# Patient Record
Sex: Female | Born: 1975 | Race: Black or African American | Hispanic: No | Marital: Married | Smoking: Never smoker
Health system: Southern US, Community
[De-identification: ages and names within clinical notes are randomized; demographics above are authoritative.]

## PROBLEM LIST (undated history)

## (undated) DIAGNOSIS — I10 Essential (primary) hypertension: Secondary | ICD-10-CM

## (undated) DIAGNOSIS — G459 Transient cerebral ischemic attack, unspecified: Secondary | ICD-10-CM

## (undated) HISTORY — PX: TUBAL LIGATION: SHX77

## (undated) HISTORY — PX: CHOLECYSTECTOMY: SHX55

---

## 2019-05-21 ENCOUNTER — Emergency Department: Payer: 59

## 2019-05-21 ENCOUNTER — Emergency Department
Admission: EM | Admit: 2019-05-21 | Discharge: 2019-05-22 | Disposition: A | Discharge status: Home / Self Care | Payer: 59 | Attending: Emergency Medicine | Admitting: Emergency Medicine

## 2019-05-21 ENCOUNTER — Other Ambulatory Visit: Payer: Self-pay

## 2019-05-21 DIAGNOSIS — G43909 Migraine, unspecified, not intractable, without status migrainosus: Secondary | ICD-10-CM | POA: Insufficient documentation

## 2019-05-21 DIAGNOSIS — I10 Essential (primary) hypertension: Secondary | ICD-10-CM | POA: Insufficient documentation

## 2019-05-21 DIAGNOSIS — R519 Headache, unspecified: Secondary | ICD-10-CM | POA: Diagnosis present

## 2019-05-21 HISTORY — DX: Essential (primary) hypertension: I10

## 2019-05-21 HISTORY — DX: Transient cerebral ischemic attack, unspecified: G45.9

## 2019-05-21 LAB — CBC WITH DIFFERENTIAL/PLATELET
Abs Immature Granulocytes: 0.01 10*3/uL (ref 0.00–0.07)
Basophils Absolute: 0 10*3/uL (ref 0.0–0.1)
Basophils Relative: 0 %
Eosinophils Absolute: 0.1 10*3/uL (ref 0.0–0.5)
Eosinophils Relative: 2 %
HCT: 40.4 % (ref 36.0–46.0)
Hemoglobin: 13.1 g/dL (ref 12.0–15.0)
Immature Granulocytes: 0 %
Lymphocytes Relative: 35 %
Lymphs Abs: 1.9 10*3/uL (ref 0.7–4.0)
MCH: 30.8 pg (ref 26.0–34.0)
MCHC: 32.4 g/dL (ref 30.0–36.0)
MCV: 95.1 fL (ref 80.0–100.0)
Monocytes Absolute: 0.5 10*3/uL (ref 0.1–1.0)
Monocytes Relative: 10 %
Neutro Abs: 2.8 10*3/uL (ref 1.7–7.7)
Neutrophils Relative %: 53 %
Platelets: 309 10*3/uL (ref 150–400)
RBC: 4.25 MIL/uL (ref 3.87–5.11)
RDW: 13.5 % (ref 11.5–15.5)
WBC: 5.4 10*3/uL (ref 4.0–10.5)
nRBC: 0 % (ref 0.0–0.2)

## 2019-05-21 LAB — BASIC METABOLIC PANEL
Anion gap: 7 (ref 5–15)
BUN: 11 mg/dL (ref 6–20)
CO2: 28 mmol/L (ref 22–32)
Calcium: 9.3 mg/dL (ref 8.9–10.3)
Chloride: 103 mmol/L (ref 98–111)
Creatinine, Ser: 0.97 mg/dL (ref 0.44–1.00)
GFR calc Af Amer: >60 mL/min (ref >60)
GFR calc non Af Amer: >60 mL/min (ref >60)
Glucose, Bld: 101 mg/dL — ABNORMAL HIGH (ref 70–99)
Potassium: 4 mmol/L (ref 3.5–5.1)
Sodium: 138 mmol/L (ref 135–145)

## 2019-05-21 MED ORDER — DEXAMETHASONE SODIUM PHOSPHATE 10 MG/ML IJ SOLN
10.0000 mg | Freq: Once | INTRAMUSCULAR | Status: AC
  Administered 2019-05-22: 01:00:00 10 mg via INTRAVENOUS
  Filled 2019-05-21: qty 1

## 2019-05-21 MED ORDER — SODIUM CHLORIDE 0.9 % IV BOLUS
500.0000 mL | Freq: Once | INTRAVENOUS | Status: AC
  Administered 2019-05-22: 500 mL via INTRAVENOUS

## 2019-05-21 MED ORDER — KETOROLAC TROMETHAMINE 30 MG/ML IJ SOLN
15.0000 mg | Freq: Once | INTRAMUSCULAR | Status: AC
  Administered 2019-05-22: 01:00:00 15 mg via INTRAVENOUS
  Filled 2019-05-21: qty 1

## 2019-05-21 MED ORDER — DROPERIDOL 2.5 MG/ML IJ SOLN
2.5000 mg | Freq: Once | INTRAMUSCULAR | Status: AC
  Administered 2019-05-22: 01:00:00 2.5 mg via INTRAVENOUS
  Filled 2019-05-21: qty 2

## 2019-05-21 NOTE — ED Triage Notes (Signed)
Pt reports headache x 3 days off and on. Pt became jittery today, took tylenol, laid down and improved,. Pt was dx with HTN 6 years ago but lost weight and came off the meds. Pt has blurry vision with the headaches. Pt had a TIA when pregnant. Pt states she has gained 20 pounds recently.

## 2019-05-21 NOTE — ED Provider Notes (Signed)
[facility] Emergency Department Provider Note  ____________________________________________   First MD Initiated Contact with Patient 05/21/19 2327     (approximate)  I have reviewed the triage vital signs and the nursing notes.   HISTORY  Chief Complaint Headache    HPI Daisy Alvarez is a 44 y.o. female with medical history as listed below who presents for evaluation of a persistent headache for about a week.  She said that it waxes and wanes but has not completely gone away.  It starts in the back of her head and radiates to the front it is a dull throbbing pain.  Occasionally she feels like she has blurry vision but this is also been going on for little bit longer and in fact has an appointment to go to eye doctor to see if she needs corrective lenses (she does not currently use anything).  She gets headaches not infrequently, and typically Tylenol makes them go away, but it does not help this time.  Today she felt "jittery" and after taking some Tylenol and lying down for rest she did actually feel better for a while but that the symptoms started again later tonight.  She said that bright lights, loud noises, and certain smells make the pain worse.  Nothing in particular makes them better unless otherwise described above.  She has not had any nausea nor vomiting.  She denies Covid contacts.  She has had no numbness nor tingling in any of her extremities and no difficulty with ambulation.  She denies shortness of breath, chest pain, cough, fever/chills, abdominal pain, and dysuria.  She has a primary care doctor.  She does not have a specific diagnosis of migraines.         Past Medical History:  Diagnosis Date  . HTN (hypertension)   . TIA (transient ischemic attack)     There are no problems to display for this patient.   Past Surgical History:  Procedure Laterality Date  . CHOLECYSTECTOMY    . TUBAL LIGATION      Prior to Admission medications    Not on File    Allergies Patient has no known allergies.  History reviewed. No pertinent family history.  Social History Social History   Tobacco Use  . Smoking status: Never Smoker  . Smokeless tobacco: Never Used  Substance Use Topics  . Alcohol use: Yes    Comment: occassion  . Drug use: Never    Review of Systems Constitutional: No fever/chills Eyes: Blurry vision, sensitivity to light. ENT: No sore throat. Cardiovascular: Denies chest pain. Respiratory: Denies shortness of breath. Gastrointestinal: No abdominal pain.  No nausea, no vomiting.  No diarrhea.  No constipation. Genitourinary: Negative for dysuria. Musculoskeletal: Negative for neck pain.  Negative for back pain. Integumentary: Negative for rash. Neurological: Generalized headache for about a week with photophobia, no focal numbness or weakness.  Sensitivity to loud noises and certain smells as well. Psychiatric:  No complaints or concerns.  ____________________________________________   PHYSICAL EXAM:  VITAL SIGNS: ED Triage Vitals  Enc Vitals Group     BP 05/21/19 2031 (!) 157/103     Pulse Rate 05/21/19 2031 79     Resp 05/21/19 2031 18     Temp 05/21/19 2031 98.1 F (36.7 C)     Temp Source 05/21/19 2031 Oral     SpO2 05/21/19 2031 100 %     Weight 05/21/19 2032 94.3 kg (208 lb)     Height 05/21/19 2032 1.676  m (5\' 6" )     Head Circumference --      Peak Flow --      Pain Score 05/21/19 2032 6     Pain Loc --      Pain Edu? --      Excl. in GC? --     Constitutional: Alert and oriented.  Generally well-appearing and in no acute distress. Eyes: Conjunctivae are normal.  Pupils are equal and reactive bilaterally. Head: Atraumatic. Nose: No congestion/rhinnorhea. Mouth/Throat: Patient is wearing a mask. Neck: No stridor.  No meningeal signs.   Cardiovascular: Normal rate, regular rhythm. Good peripheral circulation. Grossly normal heart sounds. Respiratory: Normal respiratory effort.   No retractions. Gastrointestinal: Soft and nontender. No distention.  Musculoskeletal: No lower extremity tenderness nor edema. No gross deformities of extremities. Neurologic:  Normal speech and language. No gross focal neurologic deficits are appreciated.  Skin:  Skin is warm, dry and intact. Psychiatric: Mood and affect are normal. Speech and behavior are normal.  ____________________________________________   LABS (all labs ordered are listed, but only abnormal results are displayed)  Labs Reviewed  BASIC METABOLIC PANEL - Abnormal; Notable for the following components:      Result Value   Glucose, Bld 101 (*)    All other components within normal limits  CBC WITH DIFFERENTIAL/PLATELET   ____________________________________________  EKG  No indication for emergent EKG ____________________________________________  RADIOLOGY I, 2033, personally viewed and evaluated these images (plain radiographs) as part of my medical decision making, as well as reviewing the written report by the radiologist.  ED MD interpretation: No acute abnormalities on head CT  Official radiology report(s): CT Head Wo Contrast  Result Date: 05/21/2019 CLINICAL DATA:  44 year old female with transient ischemic attack. EXAM: CT HEAD WITHOUT CONTRAST TECHNIQUE: Contiguous axial images were obtained from the base of the skull through the vertex without intravenous contrast. COMPARISON:  None. FINDINGS: Brain: The ventricles and sulci appropriate size for patient's age. The gray-white matter discrimination is preserved. There is no acute intracranial hemorrhage. No mass effect or midline shift. No extra-axial fluid collection Vascular: No hyperdense vessel or unexpected calcification. Skull: Normal. Negative for fracture or focal lesion. Sinuses/Orbits: No acute finding. Other: None IMPRESSION: Unremarkable noncontrast CT of the brain. Electronically Signed   By: 55 M.D.   On: 05/21/2019  21:05    ____________________________________________   PROCEDURES   Procedure(s) performed (including Critical Care):  Procedures   ____________________________________________   INITIAL IMPRESSION / MDM / ASSESSMENT AND PLAN / ED COURSE  As part of my medical decision making, I reviewed the following data within the electronic MEDICAL RECORD NUMBER Nursing notes reviewed and incorporated, Labs reviewed , Old chart reviewed, Notes from prior ED visits and [place] Controlled Substance Database   Differential diagnosis includes, but is not limited to, intracranial hemorrhage, meningitis/encephalitis, previous head trauma, cavernous venous thrombosis, tension headache, temporal arteritis, migraine or migraine equivalent, idiopathic intracranial hypertension, and non-specific headache.  The patient's presentation is most consistent with migraines and a persistent migraine over the last week.  She has no focal numbness or weakness.  Vital signs are generally reassuring other than some hypertension, lab work is within normal limits, and head CT is normal.  She is agreeable to my plan to try a "migraine cocktail": Droperidol 2.5 mg IV, Toradol 15 mg IV, Decadron 10 mg IV, and 500 mL normal saline IV bolus.  Anticipate she is going to feel quite a bit better.  Assuming she is feeling  better even if the headache is not completely resolved, she is comfortable with the plan for discharge and outpatient follow-up with her PCP.  There is no indication of an emergent medical condition at this time and she does not warrant emergent invasive intervention such as lumbar puncture.      Clinical Course as of May 21 226  Tue May 22, 2019  0220 Patient says she feels better and her headache is almost completely gone and she is ready to go home.  I gave my usual customary return precautions.  No evidence of an emergent medical condition at this time.   [CF]    Clinical Course User Index [CF] Loleta Rose, MD      ____________________________________________  FINAL CLINICAL IMPRESSION(S) / ED DIAGNOSES  Final diagnoses:  Migraine without status migrainosus, not intractable, unspecified migraine type     MEDICATIONS GIVEN DURING THIS VISIT:  Medications  droperidol (INAPSINE) 2.5 MG/ML injection 2.5 mg (2.5 mg Intravenous Given 05/22/19 0102)  ketorolac (TORADOL) 30 MG/ML injection 15 mg (15 mg Intravenous Given 05/22/19 0101)  sodium chloride 0.9 % bolus 500 mL (500 mLs Intravenous New Bag/Given 05/22/19 0123)  dexamethasone (DECADRON) injection 10 mg (10 mg Intravenous Given 05/22/19 0104)     ED Discharge Orders    None      *Please note:  Daisy Alvarez was evaluated in Emergency Department on 05/22/2019 for the symptoms described in the history of present illness. She was evaluated in the context of the global COVID-19 pandemic, which necessitated consideration that the patient might be at risk for infection with the SARS-CoV-2 virus that causes COVID-19. Institutional protocols and algorithms that pertain to the evaluation of patients at risk for COVID-19 are in a state of rapid change based on information released by regulatory bodies including the CDC and federal and state organizations. These policies and algorithms were followed during the patient's care in the ED.  Some ED evaluations and interventions may be delayed as a result of limited staffing during the pandemic.*  Note:  This document was prepared using Dragon voice recognition software and may include unintentional dictation errors.   Loleta Rose, MD 05/22/19 [phone]

## 2019-05-22 NOTE — Discharge Instructions (Signed)

## 2020-05-13 ENCOUNTER — Other Ambulatory Visit: Payer: 59

## 2020-05-13 ENCOUNTER — Other Ambulatory Visit: Payer: Self-pay

## 2020-05-13 DIAGNOSIS — Z20822 Contact with and (suspected) exposure to covid-19: Secondary | ICD-10-CM

## 2020-05-17 LAB — NOVEL CORONAVIRUS, NAA: SARS-CoV-2, NAA: DETECTED — AB

## 2020-11-25 IMAGING — CT CT HEAD W/O CM
3 series · 16 of 47 positions shown, 19 images · non-contrast
Comparison: None.

CLINICAL DATA: 43-year-old female with transient ischemic attack.

EXAM:
CT HEAD WITHOUT CONTRAST
TECHNIQUE: Contiguous axial images were obtained from the base of the skull
through the vertex without intravenous contrast.

[Series 3: head wo · axial · 0.42mm/px · z∈[+277,+402]mm · 10 of 31 slices shown, 13 images]
[im 3/31  brain]
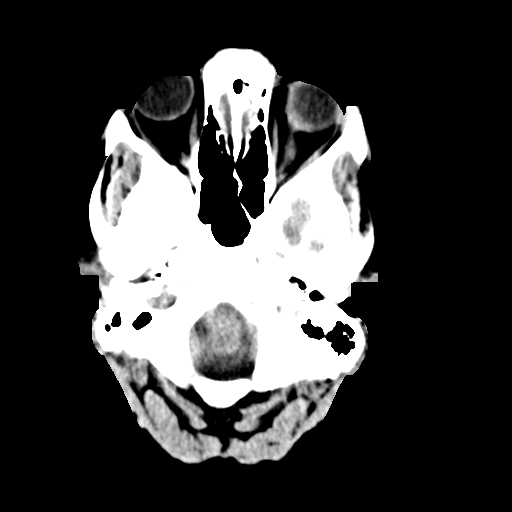
[im 3/31  bone]
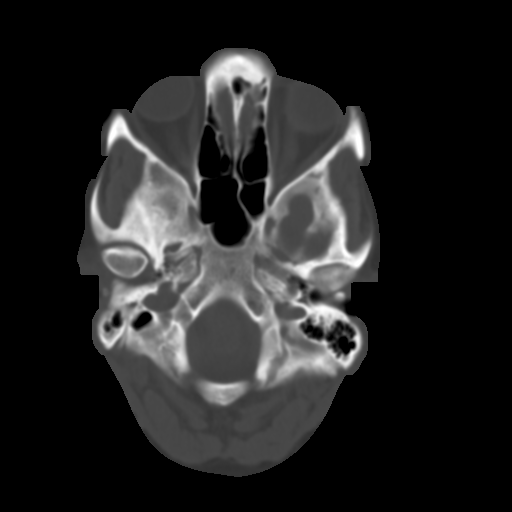
[im 6/31  brain]
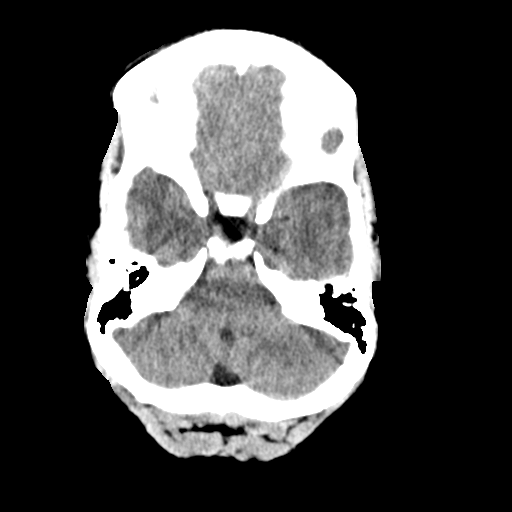
[im 9/31  brain]
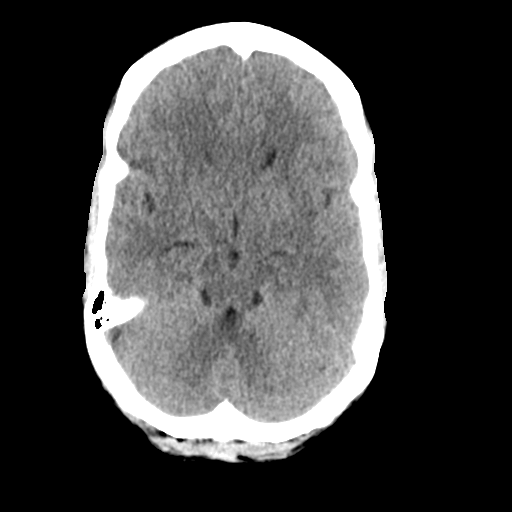
[im 11/31  brain]
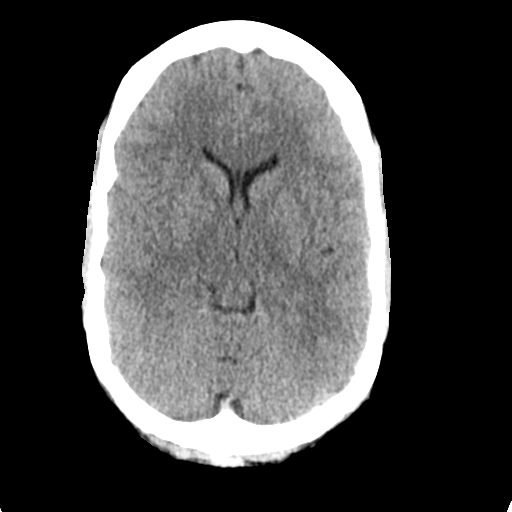
[im 14/31  brain]
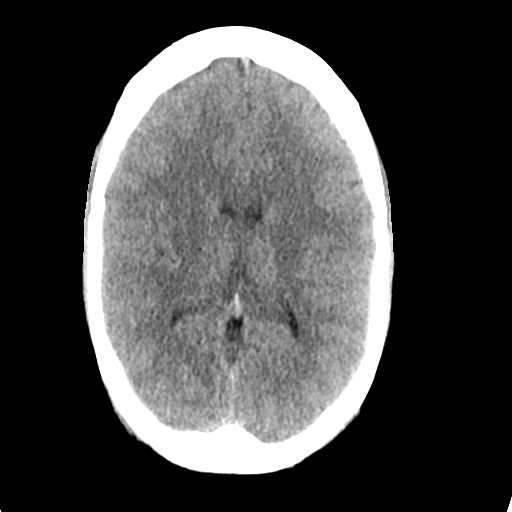
[im 14/31  bone]
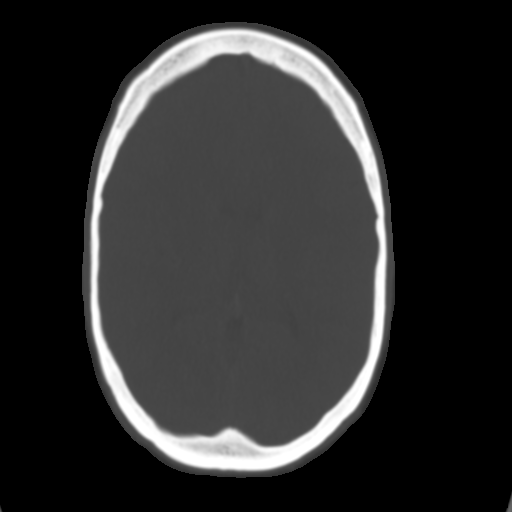
[im 17/31  brain]
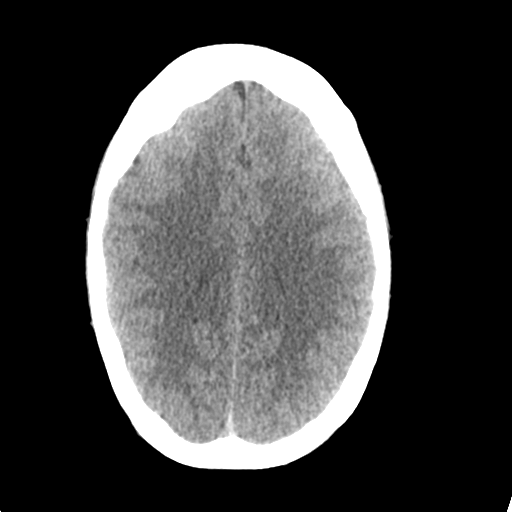
[im 20/31  brain]
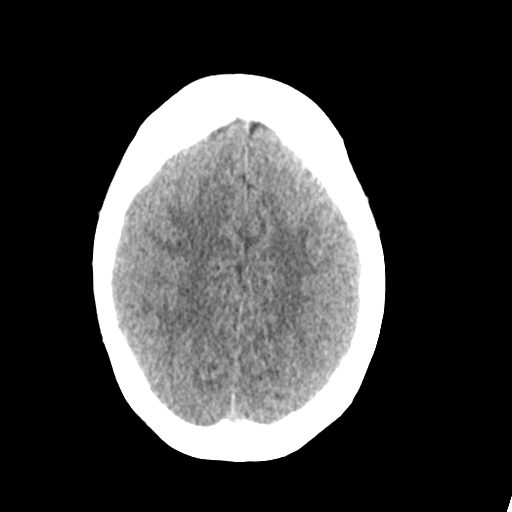
[im 23/31  brain]
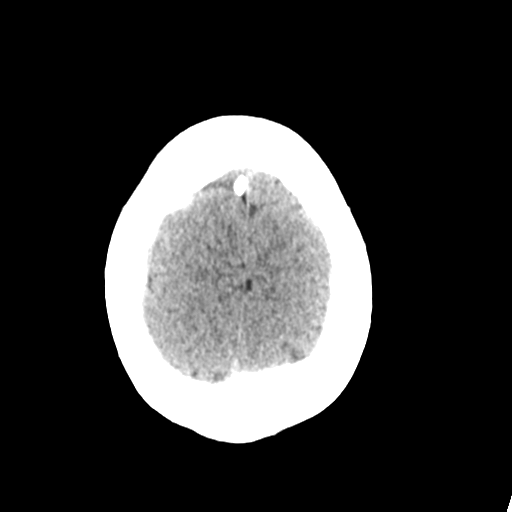
[im 25/31  brain]
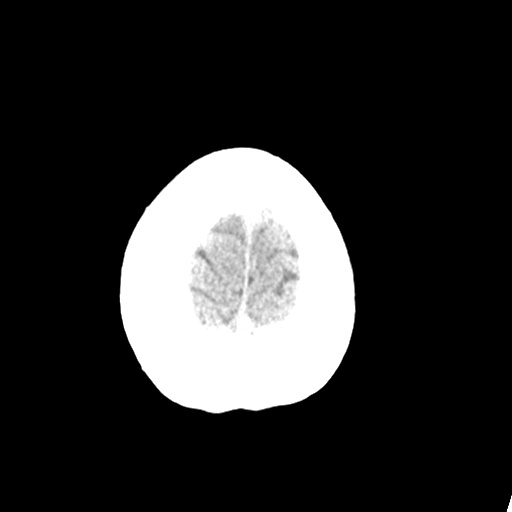
[im 25/31  bone]
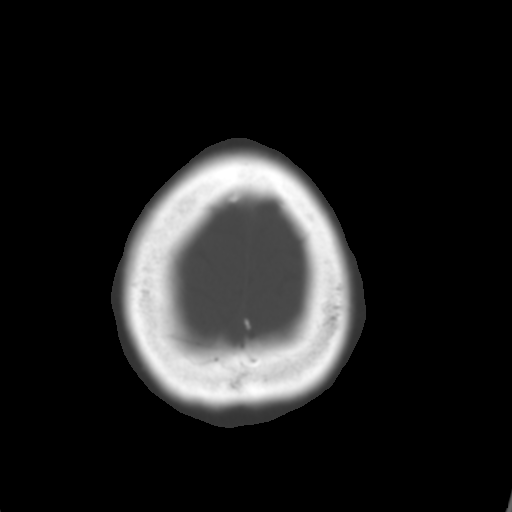
[im 28/31  brain]
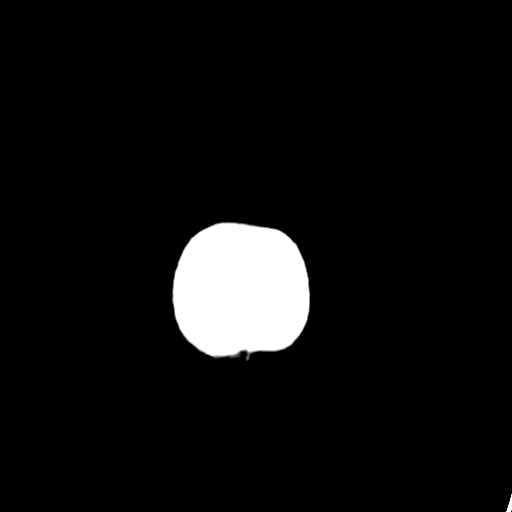

[Series 4: coronal soft tissue · coronal · 0.28mm/px · 3 of 65 slices shown]
[im 22/65  brain]
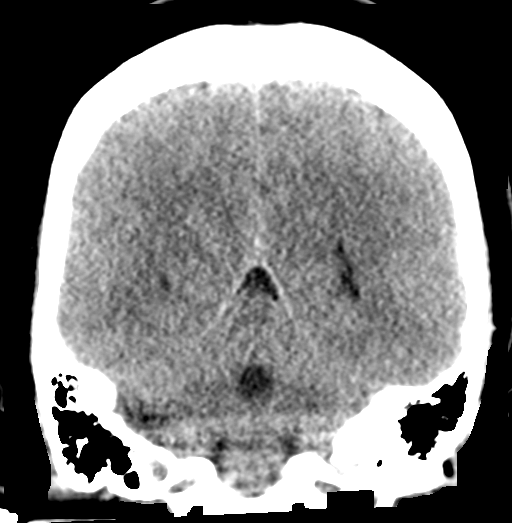
[im 29/65  brain]
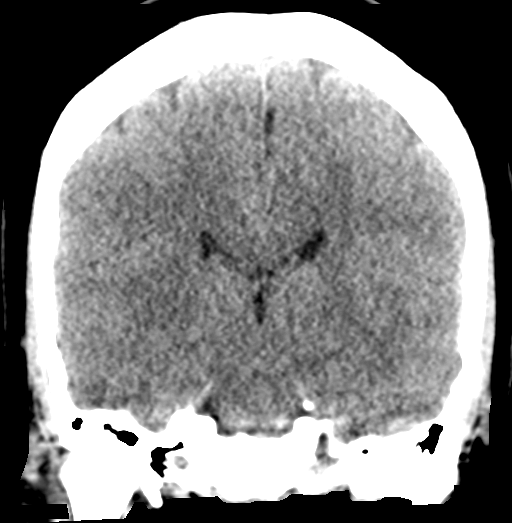
[im 36/65  brain]
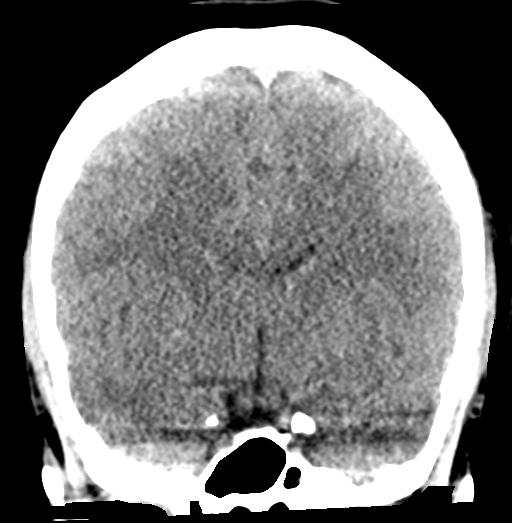

[Series 5: sagittal soft tissue · sagittal · 0.29mm/px · 3 of 48 slices shown]
[im 16/48  brain]
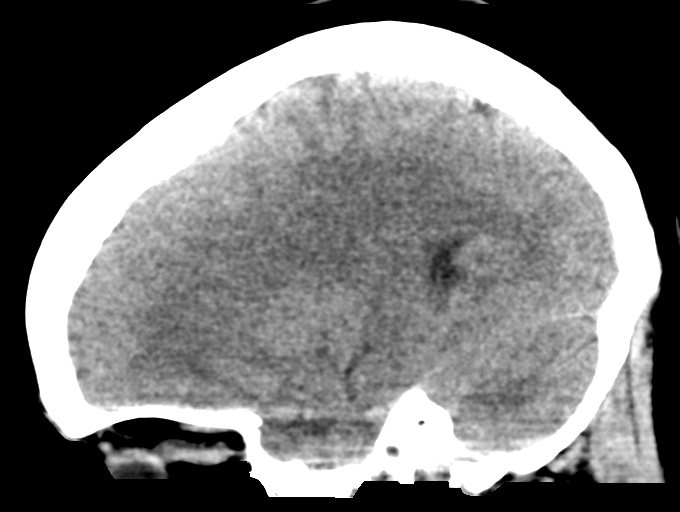
[im 24/48  brain]
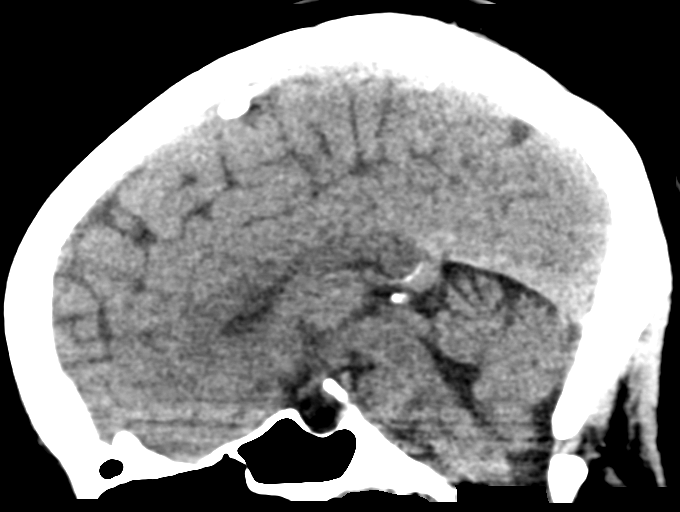
[im 32/48  brain]
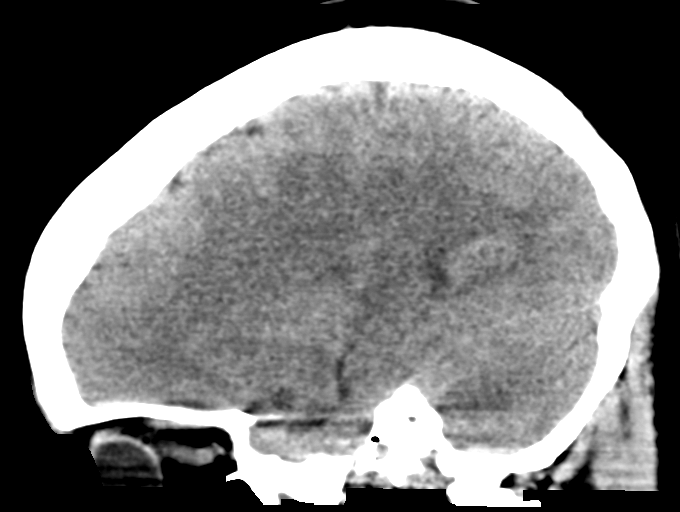

[16 of 47 positions shown; findings below may reference images not displayed]

FINDINGS: Brain: The ventricles and sulci appropriate size for patient's age.
The gray-white matter discrimination is preserved. There is no acute
intracranial hemorrhage. No mass effect or midline shift. No
extra-axial fluid collection

Vascular: No hyperdense vessel or unexpected calcification.

Skull: Normal. Negative for fracture or focal lesion.

Sinuses/Orbits: No acute finding.

Other: None
IMPRESSION: Unremarkable noncontrast CT of the brain.
